# Patient Record
Sex: Female | Born: 1990 | Race: Black or African American | Hispanic: No | Marital: Single | Smoking: Never smoker
Health system: Southern US, Community
[De-identification: ages and names within clinical notes are randomized; demographics above are authoritative.]

---

## 2019-12-24 ENCOUNTER — Other Ambulatory Visit: Payer: Self-pay

## 2019-12-24 ENCOUNTER — Encounter (HOSPITAL_COMMUNITY): Payer: Self-pay

## 2019-12-24 ENCOUNTER — Ambulatory Visit (HOSPITAL_COMMUNITY)
Admission: EM | Admit: 2019-12-24 | Discharge: 2019-12-24 | Disposition: A | Discharge status: Home / Self Care | Payer: Self-pay | Attending: Family Medicine | Admitting: Family Medicine

## 2019-12-24 DIAGNOSIS — M545 Low back pain, unspecified: Secondary | ICD-10-CM

## 2019-12-24 DIAGNOSIS — R062 Wheezing: Secondary | ICD-10-CM | POA: Insufficient documentation

## 2019-12-24 DIAGNOSIS — Z885 Allergy status to narcotic agent status: Secondary | ICD-10-CM | POA: Insufficient documentation

## 2019-12-24 DIAGNOSIS — R0602 Shortness of breath: Secondary | ICD-10-CM

## 2019-12-24 DIAGNOSIS — Z888 Allergy status to other drugs, medicaments and biological substances status: Secondary | ICD-10-CM | POA: Insufficient documentation

## 2019-12-24 DIAGNOSIS — Z20822 Contact with and (suspected) exposure to covid-19: Secondary | ICD-10-CM | POA: Insufficient documentation

## 2019-12-24 DIAGNOSIS — R630 Anorexia: Secondary | ICD-10-CM | POA: Insufficient documentation

## 2019-12-24 LAB — POCT URINALYSIS DIPSTICK, ED / UC
Bilirubin Urine: NEGATIVE
Glucose, UA: NEGATIVE mg/dL
Ketones, ur: NEGATIVE mg/dL
Nitrite: NEGATIVE
Protein, ur: NEGATIVE mg/dL
Specific Gravity, Urine: 1.015 (ref 1.005–1.030)
Urobilinogen, UA: 1 mg/dL (ref 0.0–1.0)
pH: 6.5 (ref 5.0–8.0)

## 2019-12-24 LAB — SARS CORONAVIRUS 2 (TAT 6-24 HRS): SARS Coronavirus 2: NEGATIVE

## 2019-12-24 MED ORDER — ALBUTEROL SULFATE HFA 108 (90 BASE) MCG/ACT IN AERS
INHALATION_SPRAY | RESPIRATORY_TRACT | Status: AC
  Filled 2019-12-24: qty 6.7

## 2019-12-24 MED ORDER — ALBUTEROL SULFATE HFA 108 (90 BASE) MCG/ACT IN AERS
2.0000 | INHALATION_SPRAY | Freq: Once | RESPIRATORY_TRACT | Status: AC
  Administered 2019-12-24: 2 via RESPIRATORY_TRACT

## 2019-12-24 NOTE — Discharge Instructions (Addendum)
You have been tested for COVID-19 today. °If your test returns positive, you will receive a phone call from [facility] regarding your results. °Negative test results are not called. °Both positive and negative results area always visible on MyChart. °If you do not have a MyChart account, sign up instructions are provided in your discharge papers. °Please do not hesitate to contact us should you have questions or concerns. ° °

## 2019-12-24 NOTE — ED Triage Notes (Signed)
Pt is here with chills, loss of appetite and SOB that started last night, pt has taken Tylenol to relieve discomfort.

## 2019-12-24 NOTE — ED Provider Notes (Addendum)
[facility]   [id number] 12/24/19 Arrival Time: 1006  ASSESSMENT & PLAN:  1. Wheezing   2. Loss of appetite   3. Acute bilateral low back pain without sciatica     No resp distress. No indication for chest imaging at this time. Meds ordered this encounter  Medications  . albuterol (VENTOLIN HFA) 108 (90 Base) MCG/ACT inhaler 2 puff   U/A with small leukocytes. Will send for culture. COVID-19 testing sent. See letter/work note on file for self-isolation guidelines. OTC symptom care as needed.   Follow-up Information    [facility] at [facility].   Specialty: Urgent Care Why: As needed. Contact information: [address] [place] [address] [phone]              Reviewed expectations re: course of current medical issues. Questions answered. Outlined signs and symptoms indicating need for more acute intervention. Understanding verbalized. After Visit Summary given.   SUBJECTIVE: History from: patient. Stacy Berg is a 29 y.o. female who requests COVID-19 testing. Known COVID-19 contact: none. Recent travel: none. Reports: chills, sneezing; loss of appetite and occasional wheezing beginning last evening. Denies: fever and difficulty breathing. Normal PO intake without n/v/d. Also reports lower back aches since yesterday. Patient's last menstrual period was 12/18/2019.   OBJECTIVE:  Vitals:   12/24/19 1157  BP: 131/89  Pulse: 64  Resp: 18  Temp: 98.3 F (36.8 C)  TempSrc: Oral  SpO2: 94%    General appearance: alert; no distress Eyes: PERRLA; EOMI; conjunctiva normal HENT: [place]; AT; mild nasal congestion Neck: supple  Lungs: mild exp wheezes Extremities: no edema Skin: warm and dry Neurologic: normal gait Psychological: alert and cooperative; normal mood and affect  Labs: No results found for this or any previous visit. Labs Reviewed  SARS CORONAVIRUS 2 (TAT 6-24 HRS)     Allergies  Allergen  Reactions  . Oxycodone Hives  . Tramadol Hives    History reviewed. No pertinent past medical history. Social History   Socioeconomic History  . Marital status: Single    Spouse name: Not on file  . Number of children: Not on file  . Years of education: Not on file  . Highest education level: Not on file  Occupational History  . Not on file  Tobacco Use  . Smoking status: Never Smoker  . Smokeless tobacco: Never Used  Substance and Sexual Activity  . Alcohol use: Yes  . Drug use: Never  . Sexual activity: Yes    Birth control/protection: None  Other Topics Concern  . Not on file  Social History Narrative  . Not on file   Social Determinants of Health   Financial Resource Strain:   . Difficulty of Paying Living Expenses: Not on file  Food Insecurity:   . Worried About Programme researcher, broadcasting/film/video in the Last Year: Not on file  . Ran Out of Food in the Last Year: Not on file  Transportation Needs:   . Lack of Transportation (Medical): Not on file  . Lack of Transportation (Non-Medical): Not on file  Physical Activity:   . Days of Exercise per Week: Not on file  . Minutes of Exercise per Session: Not on file  Stress:   . Feeling of Stress : Not on file  Social Connections:   . Frequency of Communication with Friends and Family: Not on file  . Frequency of Social Gatherings with Friends and Family: Not on file  . Attends Religious Services:  Not on file  . Active Member of Clubs or Organizations: Not on file  . Attends Banker Meetings: Not on file  . Marital Status: Not on file  Intimate Partner Violence:   . Fear of Current or Ex-Partner: Not on file  . Emotionally Abused: Not on file  . Physically Abused: Not on file  . Sexually Abused: Not on file   Family History  Problem Relation Age of Onset  . Diabetes Mother   . Healthy Father    History reviewed. No pertinent surgical history.   Mardella Layman, MD 12/24/19 1241    Mardella Layman, MD 12/24/19  1247

## 2019-12-25 LAB — URINE CULTURE

## 2020-03-03 ENCOUNTER — Ambulatory Visit (HOSPITAL_COMMUNITY)
Admission: EM | Admit: 2020-03-03 | Discharge: 2020-03-03 | Disposition: A | Discharge status: Home / Self Care | Payer: Self-pay | Attending: Family Medicine | Admitting: Family Medicine

## 2020-03-03 ENCOUNTER — Encounter (HOSPITAL_COMMUNITY): Payer: Self-pay

## 2020-03-03 ENCOUNTER — Other Ambulatory Visit: Payer: Self-pay

## 2020-03-03 DIAGNOSIS — R109 Unspecified abdominal pain: Secondary | ICD-10-CM

## 2020-03-03 DIAGNOSIS — L02411 Cutaneous abscess of right axilla: Secondary | ICD-10-CM

## 2020-03-03 LAB — POCT URINALYSIS DIPSTICK, ED / UC
Bilirubin Urine: NEGATIVE
Glucose, UA: NEGATIVE mg/dL
Ketones, ur: NEGATIVE mg/dL
Nitrite: NEGATIVE
Protein, ur: NEGATIVE mg/dL
Specific Gravity, Urine: 1.025 (ref 1.005–1.030)
Urobilinogen, UA: 2 mg/dL — ABNORMAL HIGH (ref 0.0–1.0)
pH: 6.5 (ref 5.0–8.0)

## 2020-03-03 MED ORDER — CEPHALEXIN 500 MG PO CAPS: 500.0000 mg | ORAL_CAPSULE | Freq: Two times a day (BID) | ORAL | 0 refills | Status: AC

## 2020-03-03 MED ORDER — KETOROLAC TROMETHAMINE 60 MG/2ML IM SOLN
INTRAMUSCULAR | Status: AC
  Filled 2020-03-03: qty 2

## 2020-03-03 MED ORDER — LIDOCAINE-EPINEPHRINE 1 %-1:100000 IJ SOLN
INTRAMUSCULAR | Status: AC
  Filled 2020-03-03: qty 1

## 2020-03-03 MED ORDER — KETOROLAC TROMETHAMINE 60 MG/2ML IM SOLN
60.0000 mg | Freq: Once | INTRAMUSCULAR | Status: AC
  Administered 2020-03-03: 60 mg via INTRAMUSCULAR

## 2020-03-03 NOTE — ED Notes (Signed)
Dressing applied to right axilla.  

## 2020-03-03 NOTE — ED Triage Notes (Addendum)
Pt in with c/o abscess under right arm that she noticed a few days ago. Also c/o left flank pain and urinary frequency. States she usually gets flank pain when she has bladder infection  Denies any drainage from area.

## 2020-03-05 LAB — URINE CULTURE: Culture: >=100000 — AB

## 2020-03-07 NOTE — ED Provider Notes (Signed)
[facility]    CSN: [id number] Arrival date & time: 03/03/20  1504      History   Chief Complaint Chief Complaint  Patient presents with  . Abscess  . left side pain    HPI Stacy Berg is a 29 y.o. female.   Patient presenting today with 2 day hx of painful lump in right axilla that has progressively become larger and more red. Has had areas like this in the past come up. Tried warm compresses and OTC pain relievers without any relief. Denies fever, chills, body aches, sweats. Also c/o left flank pain, urinary frequency and that it feels like her typical bladder infections. Denies vaginal discharge, rashes, N/V, fever, concern for STIs or pregnancy.      History reviewed. No pertinent past medical history.  There are no problems to display for this patient.   History reviewed. No pertinent surgical history.  OB History   No obstetric history on file.      Home Medications    Prior to Admission medications   Medication Sig Start Date End Date Taking? Authorizing Provider  cephALEXin (KEFLEX) 500 MG capsule Take 1 capsule (500 mg total) by mouth 2 (two) times daily. 03/03/20   Particia Nearing, PA-C    Family History Family History  Problem Relation Age of Onset  . Diabetes Mother   . Healthy Father     Social History Social History   Tobacco Use  . Smoking status: Never Smoker  . Smokeless tobacco: Never Used  Substance Use Topics  . Alcohol use: Yes  . Drug use: Never     Allergies   Oxycodone and Tramadol   Review of Systems Review of Systems PER HPI   Physical Exam Triage Vital Signs ED Triage Vitals  Enc Vitals Group     BP 03/03/20 1709 105/83     Pulse Rate 03/03/20 1706 90     Resp 03/03/20 1706 19     Temp 03/03/20 1706 98.7 F (37.1 C)     Temp Source 03/03/20 1706 Oral     SpO2 03/03/20 1706 100 %     Weight --      Height --      Head Circumference --      Peak Flow --      Pain Score 03/03/20  1704 10     Pain Loc --      Pain Edu? --      Excl. in GC? --    No data found.  Updated Vital Signs BP 105/83   Pulse 90   Temp 98.7 F (37.1 C) (Oral)   Resp 19   LMP 02/03/2020 (Approximate)   SpO2 100%   Visual Acuity Right Eye Distance:   Left Eye Distance:   Bilateral Distance:    Right Eye Near:   Left Eye Near:    Bilateral Near:     Physical Exam Vitals and nursing note reviewed.  Constitutional:      Appearance: Normal appearance. She is not ill-appearing.     Comments: Appears in significant pain from axillary mass  HENT:     Head: Atraumatic.  Eyes:     Extraocular Movements: Extraocular movements intact.     Conjunctiva/sclera: Conjunctivae normal.  Cardiovascular:     Rate and Rhythm: Normal rate and regular rhythm.     Heart sounds: Normal heart sounds.  Pulmonary:     Effort: Pulmonary effort is normal.     Breath  sounds: Normal breath sounds.  Abdominal:     General: Bowel sounds are normal. There is no distension.     Palpations: Abdomen is soft. There is no mass.     Tenderness: There is abdominal tenderness (suprapubic and left lateral/flank ttp). There is left CVA tenderness. There is no right CVA tenderness or guarding.  Musculoskeletal:        General: Normal range of motion.     Cervical back: Normal range of motion and neck supple.  Skin:    General: Skin is warm and dry.     Findings: Erythema (1.5 cm erythematous, fluctuant abscess right axilla. Significant ttp) present.  Neurological:     Mental Status: She is alert and oriented to person, place, and time.     Motor: No weakness.  Psychiatric:        Mood and Affect: Mood normal.        Thought Content: Thought content normal.        Judgment: Judgment normal.      UC Treatments / Results  Labs (all labs ordered are listed, but only abnormal results are displayed) Labs Reviewed  URINE CULTURE - Abnormal; Notable for the following components:      Result Value   Culture  >=100,000 COLONIES/mL ESCHERICHIA COLI (*)    Organism ID, Bacteria ESCHERICHIA COLI (*)    All other components within normal limits  POCT URINALYSIS DIPSTICK, ED / UC - Abnormal; Notable for the following components:   Hgb urine dipstick LARGE (*)    Urobilinogen, UA 2.0 (*)    Leukocytes,Ua SMALL (*)    All other components within normal limits    EKG   Radiology No results found.  Procedures Incision and Drainage  Date/Time: 03/03/2020 6:47 PM Performed by: Particia Nearing, PA-C Authorized by: Particia Nearing, PA-C   Consent:    Consent obtained:  Verbal   Consent given by:  Patient   Risks discussed:  Bleeding, incomplete drainage, infection and pain   Alternatives discussed:  Alternative treatment Location:    Type:  Abscess   Location: right axilla. Pre-procedure details:    Skin preparation:  Chloraprep Anesthesia (see MAR for exact dosages):    Anesthesia method:  Local infiltration   Local anesthetic:  Lidocaine 1% WITH epi Procedure type:    Complexity:  Simple Procedure details:    Needle aspiration: no     Incision types:  Stab incision   Incision depth:  Dermal   Scalpel blade:  11   Wound management:  Probed and deloculated   Drainage:  Purulent   Drainage amount:  Moderate   Wound treatment:  Wound left open   Packing materials:  None Post-procedure details:    Patient tolerance of procedure:  Tolerated well, no immediate complications   (including critical care time)  Medications Ordered in UC Medications  ketorolac (TORADOL) injection 60 mg (60 mg Intramuscular Given 03/03/20 1758)    Initial Impression / Assessment and Plan / UC Course  I have reviewed the triage vital signs and the nursing notes.  Pertinent labs & imaging results that were available during my care of the patient were reviewed by me and considered in my medical decision making (see chart for details).     I and D performed today without complication,  significant drainage expressed and pressure relieved in area. IM toradol given in clinic for pain control. U/A today showing UTI, will send out urine culture and start keflex for both  skin infection and this. Discussed hibiclens, OTC pain relievers, warm compresses and home wound care. Strict return precautions reviewed. F/u with PCP for recheck in 1 week.   Final Clinical Impressions(s) / UC Diagnoses   Final diagnoses:  Abscess of axilla, right  Left flank pain   Discharge Instructions   None    ED Prescriptions    Medication Sig Dispense Auth. Provider   cephALEXin (KEFLEX) 500 MG capsule Take 1 capsule (500 mg total) by mouth 2 (two) times daily. 14 capsule Particia Nearing, [place]     PDMP not reviewed this encounter.   Roosvelt Maser [place], [place] 03/07/20 [phone]

## 2020-03-20 ENCOUNTER — Emergency Department (HOSPITAL_COMMUNITY): Payer: Self-pay

## 2020-03-20 ENCOUNTER — Emergency Department (HOSPITAL_COMMUNITY)
Admission: EM | Admit: 2020-03-20 | Discharge: 2020-03-20 | Disposition: A | Discharge status: Home / Self Care | Payer: Self-pay | Attending: Emergency Medicine | Admitting: Emergency Medicine

## 2020-03-20 ENCOUNTER — Other Ambulatory Visit: Payer: Self-pay

## 2020-03-20 ENCOUNTER — Encounter (HOSPITAL_COMMUNITY): Payer: Self-pay

## 2020-03-20 DIAGNOSIS — R000 Tachycardia, unspecified: Secondary | ICD-10-CM | POA: Insufficient documentation

## 2020-03-20 DIAGNOSIS — S022XXA Fracture of nasal bones, initial encounter for closed fracture: Secondary | ICD-10-CM | POA: Insufficient documentation

## 2020-03-20 DIAGNOSIS — M25512 Pain in left shoulder: Secondary | ICD-10-CM | POA: Insufficient documentation

## 2020-03-20 DIAGNOSIS — R0789 Other chest pain: Secondary | ICD-10-CM | POA: Insufficient documentation

## 2020-03-20 MED ORDER — IBUPROFEN 600 MG PO TABS: 600.0000 mg | ORAL_TABLET | Freq: Four times a day (QID) | ORAL | 0 refills | Status: AC | PRN

## 2020-03-20 MED ORDER — KETOROLAC TROMETHAMINE 30 MG/ML IJ SOLN: 30.0000 mg | Freq: Once | INTRAMUSCULAR | Status: DC

## 2020-03-20 MED ORDER — KETOROLAC TROMETHAMINE 30 MG/ML IJ SOLN
30.0000 mg | Freq: Once | INTRAMUSCULAR | Status: AC
  Administered 2020-03-20: 30 mg via INTRAMUSCULAR
  Filled 2020-03-20: qty 1

## 2020-03-20 MED ORDER — METHOCARBAMOL 750 MG PO TABS: 750.0000 mg | ORAL_TABLET | Freq: Every evening | ORAL | 0 refills | Status: AC | PRN
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
## 2020-03-20 NOTE — ED Notes (Signed)
shoulder immobilizer placed, pt and girlfriend educated.

## 2020-03-20 NOTE — Discharge Instructions (Addendum)
You may alternate taking Tylenol and Ibuprofen as needed for pain control. You may take 400-600 mg of ibuprofen every 6 hours and [phone] mg of Tylenol every 6 hours. Do not exceed 4000 mg of Tylenol daily as this can lead to liver damage. Also, make sure to take Ibuprofen with meals as it can cause an upset stomach. Do not take other NSAIDs while taking Ibuprofen such as (Aleve, Naprosyn, Aspirin, Celebrex, etc) and do not take more than the prescribed dose as this can lead to ulcers and bleeding in your GI tract. You may use warm and cold compresses to help with your symptoms.   You were given a prescription for Robaxin which is a muscle relaxer.  You should not drive, work, or operate machinery while taking this medication as it can make you very drowsy.  Do not take robaxin or ibuprofen if you are pregnant.  Please follow up the ear nose and throat doctor and the orthopedic within the next 7-10 days for re-evaluation and further treatment of your symptoms.   Please return to the ER sooner if you have any new or worsening symptoms.
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
## 2020-03-26 ENCOUNTER — Ambulatory Visit: Payer: Self-pay | Admitting: Family
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
